# Patient Record
Sex: Female | Born: 1950 | Race: White | Hispanic: No | Marital: Married
Health system: Southern US, Community
[De-identification: ages and names within clinical notes are randomized; demographics above are authoritative.]

---

## 1999-01-14 ENCOUNTER — Other Ambulatory Visit: Admission: RE | Admit: 1999-01-14 | Discharge: 1999-01-14 | Payer: Self-pay | Admitting: Gynecology

## 2000-04-05 ENCOUNTER — Other Ambulatory Visit: Admission: RE | Admit: 2000-04-05 | Discharge: 2000-04-05 | Payer: Self-pay | Admitting: Gynecology

## 2002-05-23 ENCOUNTER — Other Ambulatory Visit: Admission: RE | Admit: 2002-05-23 | Discharge: 2002-05-23 | Payer: Self-pay | Admitting: Gynecology

## 2003-06-11 ENCOUNTER — Other Ambulatory Visit: Admission: RE | Admit: 2003-06-11 | Discharge: 2003-06-11 | Payer: Self-pay | Admitting: Gynecology

## 2004-06-17 ENCOUNTER — Other Ambulatory Visit: Admission: RE | Admit: 2004-06-17 | Discharge: 2004-06-17 | Payer: Self-pay | Admitting: Gynecology

## 2005-07-06 ENCOUNTER — Other Ambulatory Visit: Admission: RE | Admit: 2005-07-06 | Discharge: 2005-07-06 | Payer: Self-pay | Admitting: Gynecology

## 2014-04-05 ENCOUNTER — Other Ambulatory Visit: Payer: Self-pay | Admitting: Gynecology

## 2014-04-05 DIAGNOSIS — R928 Other abnormal and inconclusive findings on diagnostic imaging of breast: Secondary | ICD-10-CM

## 2014-04-11 ENCOUNTER — Ambulatory Visit
Admission: RE | Admit: 2014-04-11 | Discharge: 2014-04-11 | Disposition: A | Discharge status: Home / Self Care | Payer: 59 | Source: Ambulatory Visit | Attending: Gynecology | Admitting: Gynecology

## 2014-04-11 ENCOUNTER — Encounter (INDEPENDENT_AMBULATORY_CARE_PROVIDER_SITE_OTHER): Payer: Self-pay

## 2014-04-11 DIAGNOSIS — R928 Other abnormal and inconclusive findings on diagnostic imaging of breast: Secondary | ICD-10-CM

## 2015-01-21 ENCOUNTER — Other Ambulatory Visit: Payer: Self-pay

## 2015-05-20 ENCOUNTER — Other Ambulatory Visit: Payer: Self-pay | Admitting: Obstetrics & Gynecology

## 2015-05-20 DIAGNOSIS — R928 Other abnormal and inconclusive findings on diagnostic imaging of breast: Secondary | ICD-10-CM

## 2015-05-29 ENCOUNTER — Ambulatory Visit
Admission: RE | Admit: 2015-05-29 | Discharge: 2015-05-29 | Disposition: A | Discharge status: Home / Self Care | Payer: Self-pay | Source: Ambulatory Visit | Attending: Obstetrics & Gynecology | Admitting: Obstetrics & Gynecology

## 2015-05-29 DIAGNOSIS — R928 Other abnormal and inconclusive findings on diagnostic imaging of breast: Secondary | ICD-10-CM

## 2016-03-23 DIAGNOSIS — H2513 Age-related nuclear cataract, bilateral: Secondary | ICD-10-CM | POA: Diagnosis not present

## 2016-03-23 DIAGNOSIS — H5213 Myopia, bilateral: Secondary | ICD-10-CM | POA: Diagnosis not present

## 2016-04-17 DIAGNOSIS — Z23 Encounter for immunization: Secondary | ICD-10-CM | POA: Diagnosis not present

## 2016-06-08 DIAGNOSIS — Z6832 Body mass index (BMI) 32.0-32.9, adult: Secondary | ICD-10-CM | POA: Diagnosis not present

## 2016-06-08 DIAGNOSIS — Z01419 Encounter for gynecological examination (general) (routine) without abnormal findings: Secondary | ICD-10-CM | POA: Diagnosis not present

## 2016-06-08 DIAGNOSIS — Z1231 Encounter for screening mammogram for malignant neoplasm of breast: Secondary | ICD-10-CM | POA: Diagnosis not present

## 2016-06-10 DIAGNOSIS — Z124 Encounter for screening for malignant neoplasm of cervix: Secondary | ICD-10-CM | POA: Diagnosis not present

## 2016-06-10 DIAGNOSIS — Z01419 Encounter for gynecological examination (general) (routine) without abnormal findings: Secondary | ICD-10-CM | POA: Diagnosis not present

## 2016-06-10 DIAGNOSIS — Z3202 Encounter for pregnancy test, result negative: Secondary | ICD-10-CM | POA: Diagnosis not present

## 2016-06-11 DIAGNOSIS — Z1159 Encounter for screening for other viral diseases: Secondary | ICD-10-CM | POA: Diagnosis not present

## 2016-06-11 DIAGNOSIS — Z0000 Encounter for general adult medical examination without abnormal findings: Secondary | ICD-10-CM | POA: Diagnosis not present

## 2016-06-11 DIAGNOSIS — S8992XA Unspecified injury of left lower leg, initial encounter: Secondary | ICD-10-CM | POA: Diagnosis not present

## 2016-06-11 DIAGNOSIS — Z23 Encounter for immunization: Secondary | ICD-10-CM | POA: Diagnosis not present

## 2016-06-11 DIAGNOSIS — M25562 Pain in left knee: Secondary | ICD-10-CM | POA: Diagnosis not present

## 2016-06-11 DIAGNOSIS — M8589 Other specified disorders of bone density and structure, multiple sites: Secondary | ICD-10-CM | POA: Diagnosis not present

## 2016-11-10 IMAGING — MG MM DIAG BREAST TOMO BILATERAL
8 series · 9 of 24 positions shown · non-contrast
Comparison: 05/14/2015 and prior mammograms dating back to
09/08/2010.

CLINICAL DATA: 63-year-old female with possible bilateral breast
masses on screening mammogram.

EXAM:
DIGITAL DIAGNOSTIC BILATERAL MAMMOGRAM WITH 3D TOMOSYNTHESIS AND CAD

[L MLO]
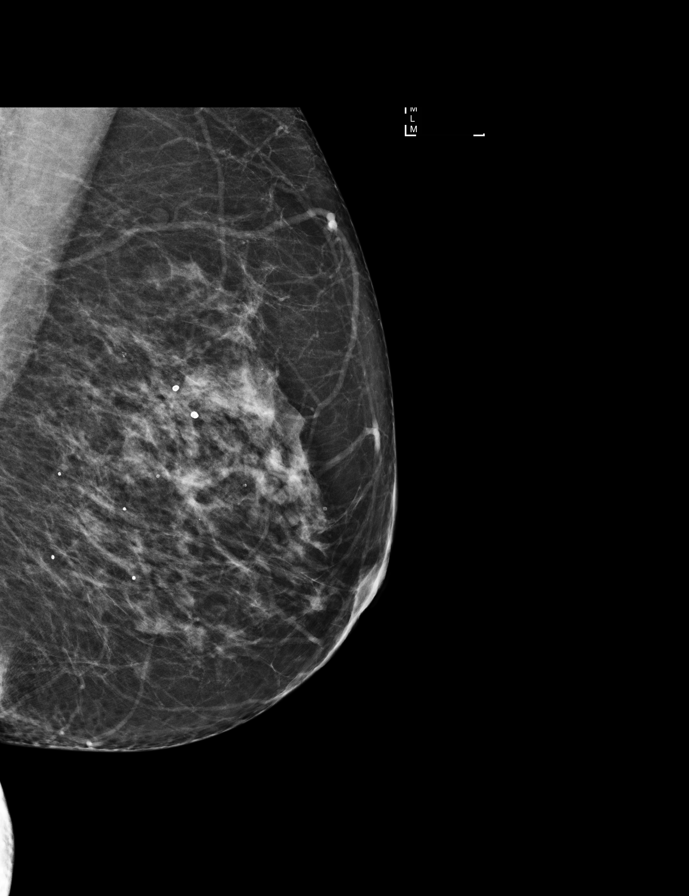

[L CC]
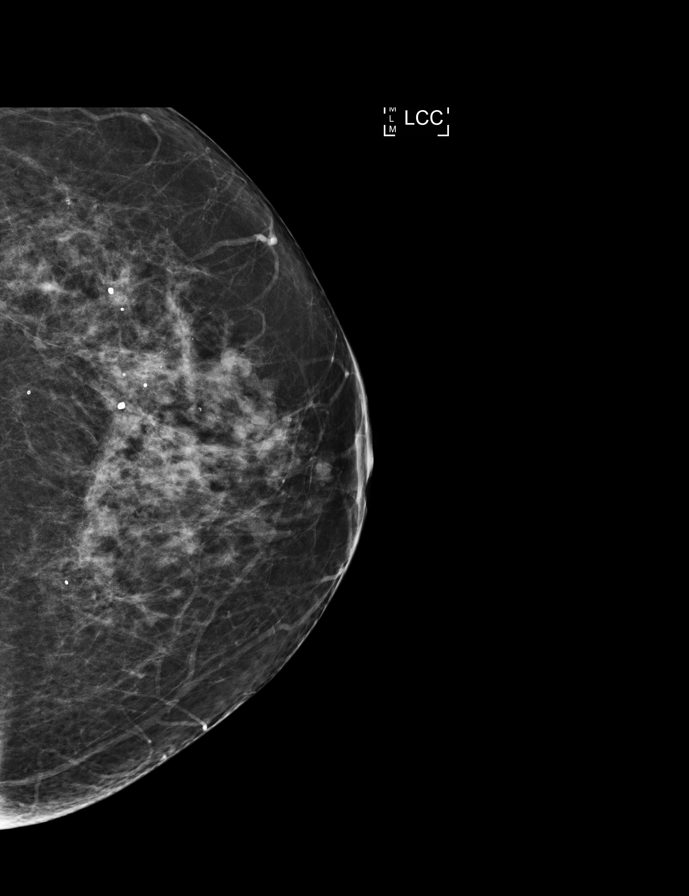

[R CC]
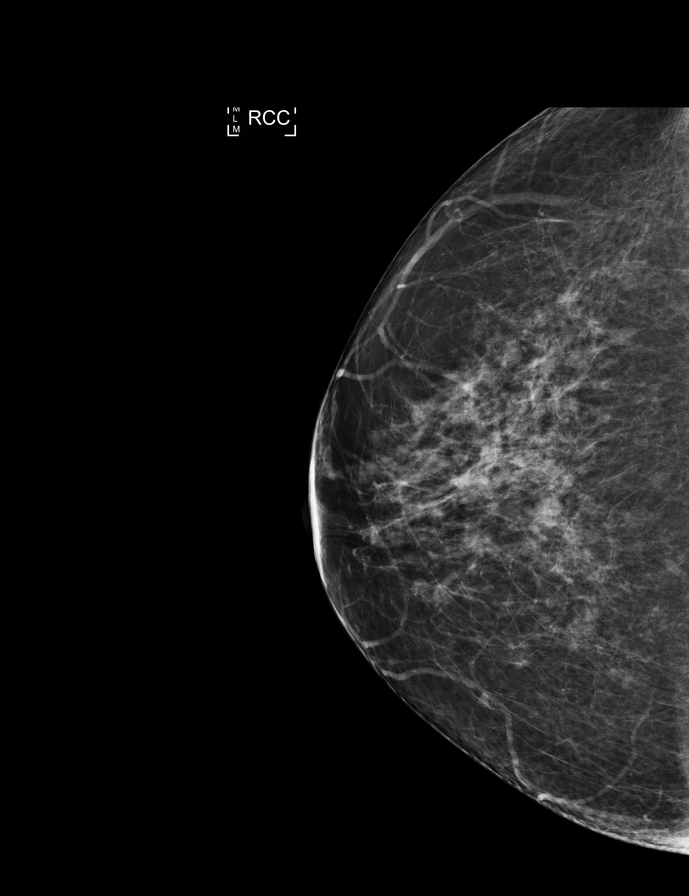

[R MLO]
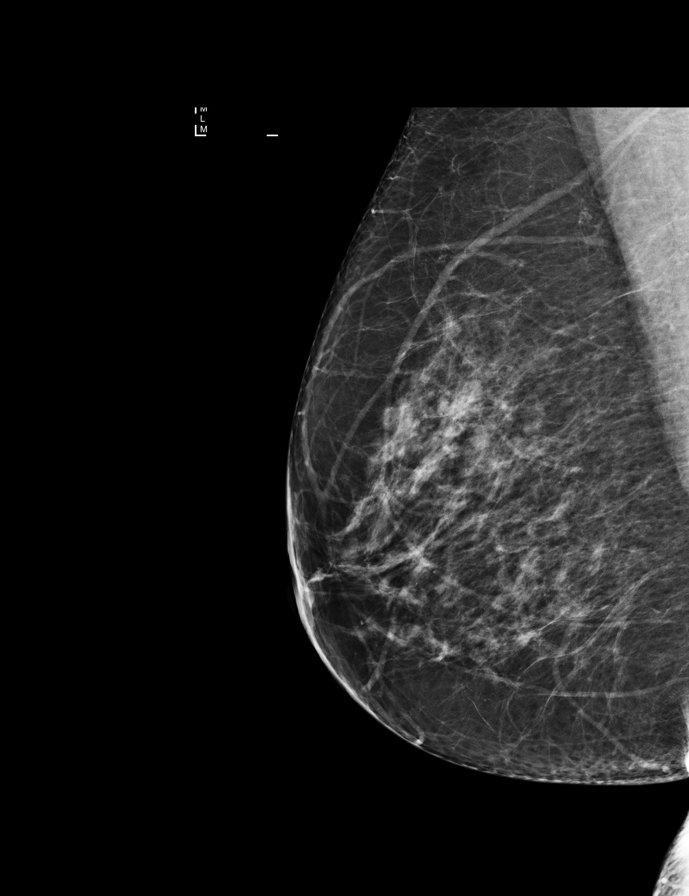

[L MLO tomo · 2 of 71 frames shown]
[frame 23/71]
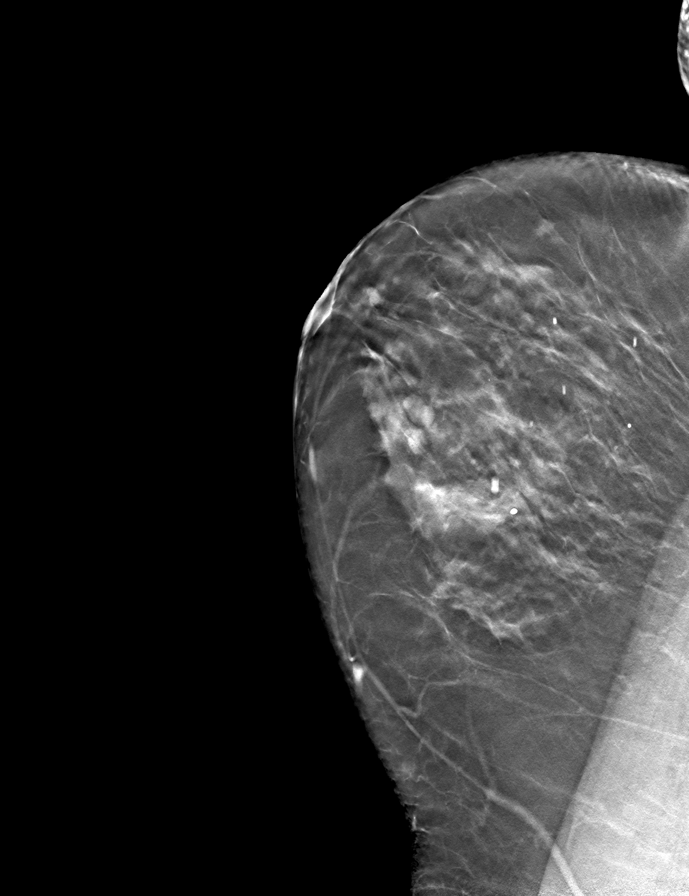
[frame 36/71]
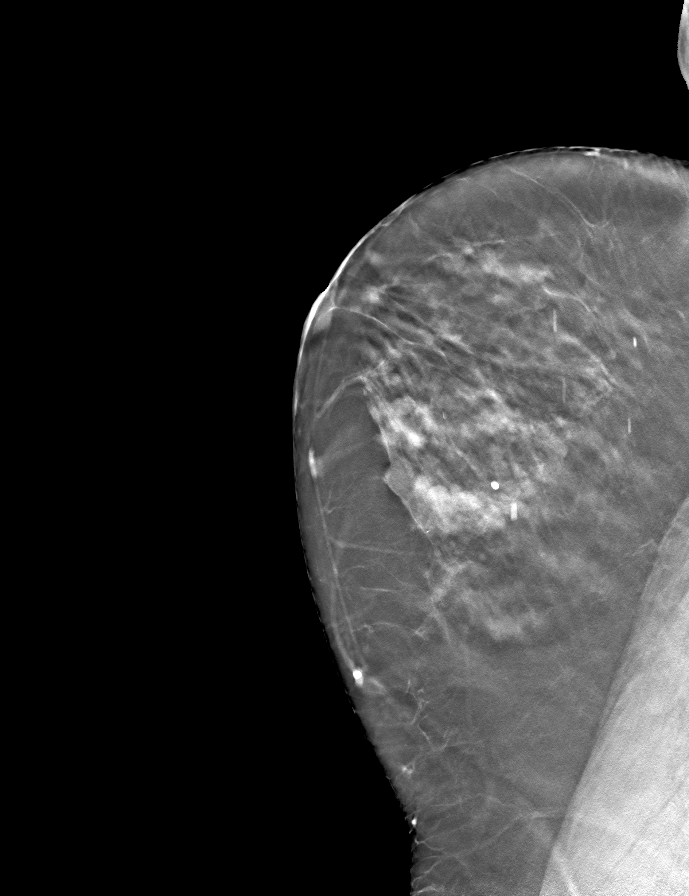

[R MLO tomo · tomo slice 35/69.0]
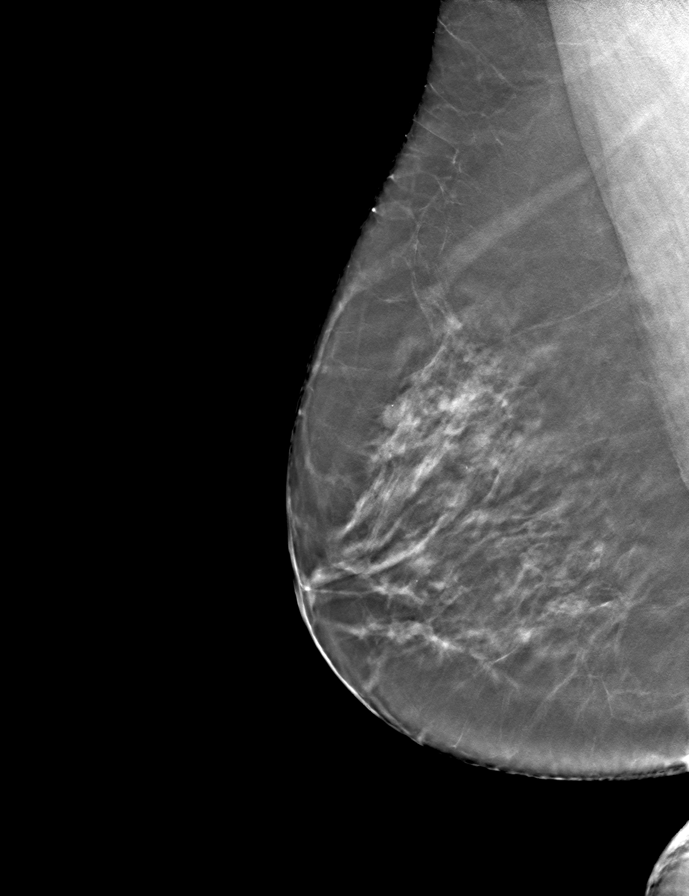

[L CC tomo · tomo slice 32/63.0]
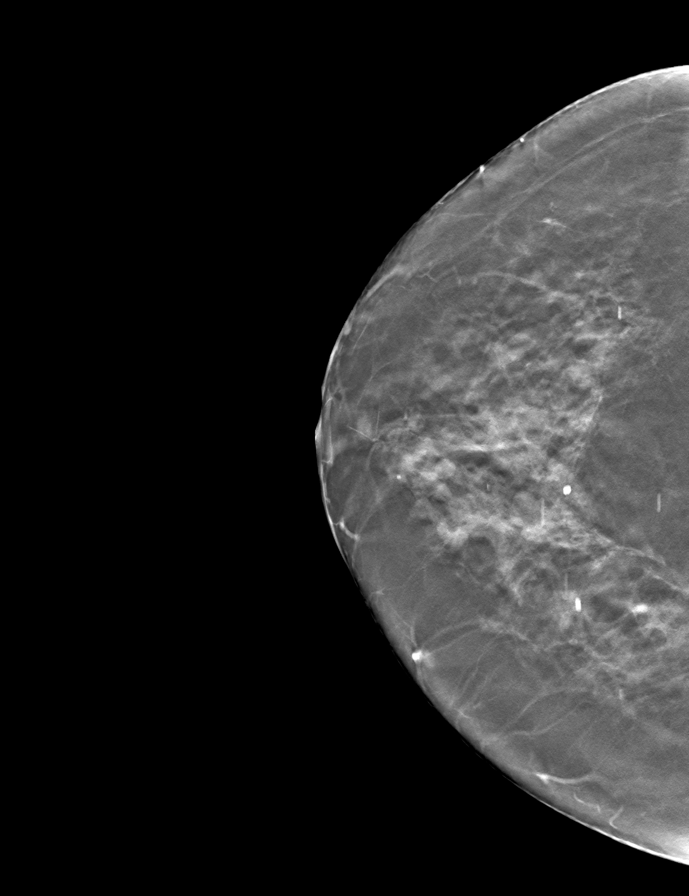

[R CC tomo · tomo slice 33/66.0]
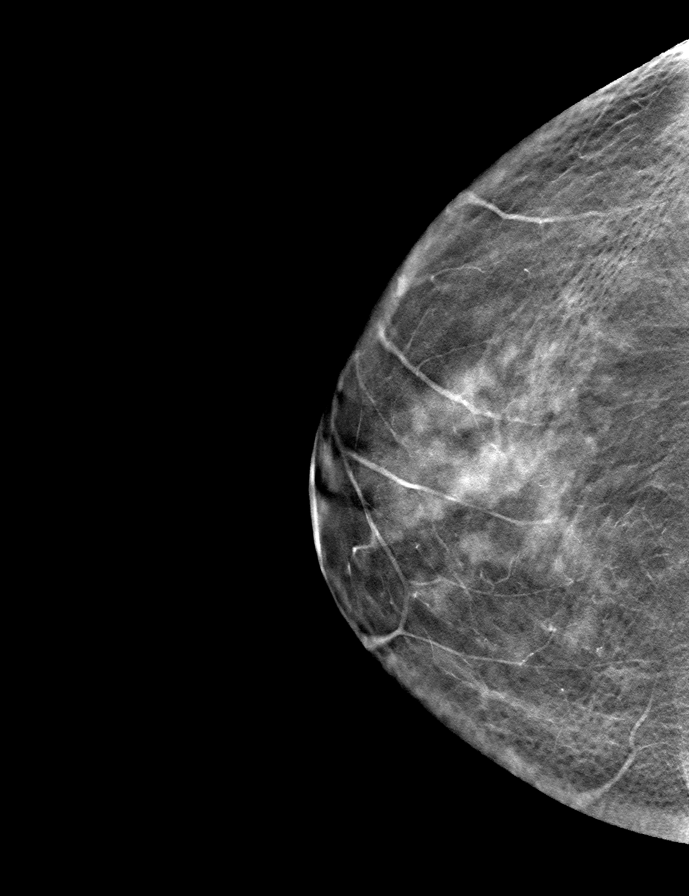

[9 of 24 positions shown; findings below may reference images not displayed]

ACR Breast Density Category b: There are scattered areas of
fibroglandular density.
FINDINGS: 2D and 3D CC and MLO full field views of both breasts demonstrate no
suspicious mass, distortion or worrisome calcifications bilaterally.

No persistent suspicious abnormalities are identified in the areas
of the screening study findings.

Mammographic images were processed with CAD.
IMPRESSION: No mammographic evidence of breast malignancy. No persistent
abnormalities in the areas of the screening study findings.

RECOMMENDATION:
Bilateral screening mammograms in 1 year.

I have discussed the findings and recommendations with the patient.
Results were also provided in writing at the conclusion of the
visit. If applicable, a reminder letter will be sent to the patient
regarding the next appointment.

BI-RADS CATEGORY  1: Negative.

## 2017-04-06 DIAGNOSIS — H43391 Other vitreous opacities, right eye: Secondary | ICD-10-CM | POA: Diagnosis not present

## 2017-04-06 DIAGNOSIS — H43811 Vitreous degeneration, right eye: Secondary | ICD-10-CM | POA: Diagnosis not present

## 2017-04-06 DIAGNOSIS — H2513 Age-related nuclear cataract, bilateral: Secondary | ICD-10-CM | POA: Diagnosis not present

## 2017-04-06 DIAGNOSIS — H5213 Myopia, bilateral: Secondary | ICD-10-CM | POA: Diagnosis not present

## 2017-07-15 DIAGNOSIS — Z1231 Encounter for screening mammogram for malignant neoplasm of breast: Secondary | ICD-10-CM | POA: Diagnosis not present

## 2017-08-11 DIAGNOSIS — Z961 Presence of intraocular lens: Secondary | ICD-10-CM | POA: Diagnosis not present

## 2017-08-11 DIAGNOSIS — H52223 Regular astigmatism, bilateral: Secondary | ICD-10-CM | POA: Diagnosis not present

## 2018-02-02 DIAGNOSIS — Z0000 Encounter for general adult medical examination without abnormal findings: Secondary | ICD-10-CM | POA: Diagnosis not present

## 2018-04-19 DIAGNOSIS — H2513 Age-related nuclear cataract, bilateral: Secondary | ICD-10-CM | POA: Diagnosis not present

## 2018-04-19 DIAGNOSIS — H25012 Cortical age-related cataract, left eye: Secondary | ICD-10-CM | POA: Diagnosis not present

## 2018-04-19 DIAGNOSIS — H52203 Unspecified astigmatism, bilateral: Secondary | ICD-10-CM | POA: Diagnosis not present

## 2018-04-19 DIAGNOSIS — H524 Presbyopia: Secondary | ICD-10-CM | POA: Diagnosis not present

## 2018-04-19 DIAGNOSIS — H43811 Vitreous degeneration, right eye: Secondary | ICD-10-CM | POA: Diagnosis not present

## 2018-04-19 DIAGNOSIS — H5213 Myopia, bilateral: Secondary | ICD-10-CM | POA: Diagnosis not present

## 2018-04-19 DIAGNOSIS — H43393 Other vitreous opacities, bilateral: Secondary | ICD-10-CM | POA: Diagnosis not present

## 2018-05-23 DIAGNOSIS — M4004 Postural kyphosis, thoracic region: Secondary | ICD-10-CM | POA: Diagnosis not present

## 2018-05-23 DIAGNOSIS — Z0000 Encounter for general adult medical examination without abnormal findings: Secondary | ICD-10-CM | POA: Diagnosis not present

## 2018-05-23 DIAGNOSIS — I10 Essential (primary) hypertension: Secondary | ICD-10-CM | POA: Diagnosis not present

## 2018-05-23 DIAGNOSIS — Z23 Encounter for immunization: Secondary | ICD-10-CM | POA: Diagnosis not present

## 2018-05-23 DIAGNOSIS — M810 Age-related osteoporosis without current pathological fracture: Secondary | ICD-10-CM | POA: Diagnosis not present

## 2018-06-07 DIAGNOSIS — Z780 Asymptomatic menopausal state: Secondary | ICD-10-CM | POA: Diagnosis not present

## 2018-06-07 DIAGNOSIS — M810 Age-related osteoporosis without current pathological fracture: Secondary | ICD-10-CM | POA: Diagnosis not present

## 2018-06-07 DIAGNOSIS — M8589 Other specified disorders of bone density and structure, multiple sites: Secondary | ICD-10-CM | POA: Diagnosis not present

## 2018-07-04 DIAGNOSIS — I10 Essential (primary) hypertension: Secondary | ICD-10-CM | POA: Diagnosis not present

## 2018-08-09 DIAGNOSIS — Z6831 Body mass index (BMI) 31.0-31.9, adult: Secondary | ICD-10-CM | POA: Diagnosis not present

## 2018-08-09 DIAGNOSIS — Z1231 Encounter for screening mammogram for malignant neoplasm of breast: Secondary | ICD-10-CM | POA: Diagnosis not present

## 2018-08-09 DIAGNOSIS — Z01419 Encounter for gynecological examination (general) (routine) without abnormal findings: Secondary | ICD-10-CM | POA: Diagnosis not present

## 2018-08-09 DIAGNOSIS — Z124 Encounter for screening for malignant neoplasm of cervix: Secondary | ICD-10-CM | POA: Diagnosis not present

## 2019-01-04 DIAGNOSIS — I10 Essential (primary) hypertension: Secondary | ICD-10-CM | POA: Diagnosis not present

## 2019-02-03 DIAGNOSIS — Z0000 Encounter for general adult medical examination without abnormal findings: Secondary | ICD-10-CM | POA: Diagnosis not present

## 2019-02-03 DIAGNOSIS — I10 Essential (primary) hypertension: Secondary | ICD-10-CM | POA: Diagnosis not present

## 2019-04-24 DIAGNOSIS — H25012 Cortical age-related cataract, left eye: Secondary | ICD-10-CM | POA: Diagnosis not present

## 2019-04-24 DIAGNOSIS — H2513 Age-related nuclear cataract, bilateral: Secondary | ICD-10-CM | POA: Diagnosis not present

## 2019-04-24 DIAGNOSIS — H524 Presbyopia: Secondary | ICD-10-CM | POA: Diagnosis not present

## 2019-04-24 DIAGNOSIS — H5213 Myopia, bilateral: Secondary | ICD-10-CM | POA: Diagnosis not present

## 2019-04-24 DIAGNOSIS — H43813 Vitreous degeneration, bilateral: Secondary | ICD-10-CM | POA: Diagnosis not present

## 2019-04-24 DIAGNOSIS — H43393 Other vitreous opacities, bilateral: Secondary | ICD-10-CM | POA: Diagnosis not present

## 2019-04-24 DIAGNOSIS — H52203 Unspecified astigmatism, bilateral: Secondary | ICD-10-CM | POA: Diagnosis not present

## 2019-08-03 DIAGNOSIS — I10 Essential (primary) hypertension: Secondary | ICD-10-CM | POA: Diagnosis not present

## 2019-08-17 DIAGNOSIS — Z1231 Encounter for screening mammogram for malignant neoplasm of breast: Secondary | ICD-10-CM | POA: Diagnosis not present

## 2019-11-23 DIAGNOSIS — L438 Other lichen planus: Secondary | ICD-10-CM | POA: Diagnosis not present

## 2019-11-23 DIAGNOSIS — B354 Tinea corporis: Secondary | ICD-10-CM | POA: Diagnosis not present

## 2019-11-23 DIAGNOSIS — R21 Rash and other nonspecific skin eruption: Secondary | ICD-10-CM | POA: Diagnosis not present

## 2020-01-31 DIAGNOSIS — M8949 Other hypertrophic osteoarthropathy, multiple sites: Secondary | ICD-10-CM | POA: Diagnosis not present

## 2020-01-31 DIAGNOSIS — I10 Essential (primary) hypertension: Secondary | ICD-10-CM | POA: Diagnosis not present

## 2020-01-31 DIAGNOSIS — M810 Age-related osteoporosis without current pathological fracture: Secondary | ICD-10-CM | POA: Diagnosis not present

## 2020-02-12 DIAGNOSIS — Z0000 Encounter for general adult medical examination without abnormal findings: Secondary | ICD-10-CM | POA: Diagnosis not present

## 2020-04-29 DIAGNOSIS — H524 Presbyopia: Secondary | ICD-10-CM | POA: Diagnosis not present

## 2020-04-29 DIAGNOSIS — H52203 Unspecified astigmatism, bilateral: Secondary | ICD-10-CM | POA: Diagnosis not present

## 2020-04-29 DIAGNOSIS — H25012 Cortical age-related cataract, left eye: Secondary | ICD-10-CM | POA: Diagnosis not present

## 2020-04-29 DIAGNOSIS — H35363 Drusen (degenerative) of macula, bilateral: Secondary | ICD-10-CM | POA: Diagnosis not present

## 2020-04-29 DIAGNOSIS — H5213 Myopia, bilateral: Secondary | ICD-10-CM | POA: Diagnosis not present

## 2020-04-29 DIAGNOSIS — H43393 Other vitreous opacities, bilateral: Secondary | ICD-10-CM | POA: Diagnosis not present

## 2020-04-29 DIAGNOSIS — H2513 Age-related nuclear cataract, bilateral: Secondary | ICD-10-CM | POA: Diagnosis not present

## 2020-04-29 DIAGNOSIS — H43813 Vitreous degeneration, bilateral: Secondary | ICD-10-CM | POA: Diagnosis not present

## 2020-05-25 ENCOUNTER — Ambulatory Visit: Payer: Self-pay

## 2020-05-25 ENCOUNTER — Ambulatory Visit: Payer: Medicare Other | Attending: Internal Medicine

## 2020-05-25 DIAGNOSIS — Z23 Encounter for immunization: Secondary | ICD-10-CM

## 2020-05-25 NOTE — Progress Notes (Signed)
   Cov[facility] Vaccination [facility]  Name:  Samentha Perham    MRN: [id number] DOB: 1951/07/26  05/25/2020  Ms. Spadaccini was observed post Covid-19 immunization for 15 minutes without incident. She was provided with Vaccine Information Sheet and instruction to access the V-Safe system.   Ms. Hane was instructed to call [phone] with any severe reactions post vaccine: Marland Kitchen Difficulty breathing  . Swelling of face and throat  . A fast heartbeat  . A bad rash all over body  . Dizziness and weakness

## 2020-06-11 DIAGNOSIS — M8580 Other specified disorders of bone density and structure, unspecified site: Secondary | ICD-10-CM | POA: Diagnosis not present

## 2020-06-11 DIAGNOSIS — M8588 Other specified disorders of bone density and structure, other site: Secondary | ICD-10-CM | POA: Diagnosis not present

## 2020-06-11 DIAGNOSIS — Z780 Asymptomatic menopausal state: Secondary | ICD-10-CM | POA: Diagnosis not present

## 2020-08-02 DIAGNOSIS — Z0000 Encounter for general adult medical examination without abnormal findings: Secondary | ICD-10-CM | POA: Diagnosis not present

## 2020-08-02 DIAGNOSIS — Z7982 Long term (current) use of aspirin: Secondary | ICD-10-CM | POA: Diagnosis not present

## 2020-08-02 DIAGNOSIS — I10 Essential (primary) hypertension: Secondary | ICD-10-CM | POA: Diagnosis not present

## 2020-09-02 DIAGNOSIS — Z6829 Body mass index (BMI) 29.0-29.9, adult: Secondary | ICD-10-CM | POA: Diagnosis not present

## 2020-09-02 DIAGNOSIS — Z124 Encounter for screening for malignant neoplasm of cervix: Secondary | ICD-10-CM | POA: Diagnosis not present

## 2020-09-02 DIAGNOSIS — Z01419 Encounter for gynecological examination (general) (routine) without abnormal findings: Secondary | ICD-10-CM | POA: Diagnosis not present

## 2020-09-02 DIAGNOSIS — Z1231 Encounter for screening mammogram for malignant neoplasm of breast: Secondary | ICD-10-CM | POA: Diagnosis not present

## 2021-03-03 DIAGNOSIS — I10 Essential (primary) hypertension: Secondary | ICD-10-CM | POA: Diagnosis not present

## 2021-03-03 DIAGNOSIS — Z0000 Encounter for general adult medical examination without abnormal findings: Secondary | ICD-10-CM | POA: Diagnosis not present

## 2021-05-06 DIAGNOSIS — H524 Presbyopia: Secondary | ICD-10-CM | POA: Diagnosis not present

## 2021-05-06 DIAGNOSIS — H25012 Cortical age-related cataract, left eye: Secondary | ICD-10-CM | POA: Diagnosis not present

## 2021-05-06 DIAGNOSIS — H5213 Myopia, bilateral: Secondary | ICD-10-CM | POA: Diagnosis not present

## 2021-05-06 DIAGNOSIS — H43393 Other vitreous opacities, bilateral: Secondary | ICD-10-CM | POA: Diagnosis not present

## 2021-05-06 DIAGNOSIS — H43813 Vitreous degeneration, bilateral: Secondary | ICD-10-CM | POA: Diagnosis not present

## 2021-05-06 DIAGNOSIS — H52203 Unspecified astigmatism, bilateral: Secondary | ICD-10-CM | POA: Diagnosis not present

## 2021-05-06 DIAGNOSIS — H35363 Drusen (degenerative) of macula, bilateral: Secondary | ICD-10-CM | POA: Diagnosis not present

## 2021-05-06 DIAGNOSIS — H2513 Age-related nuclear cataract, bilateral: Secondary | ICD-10-CM | POA: Diagnosis not present
